# Patient Record
Sex: Female | Born: 1995 | Race: Black or African American | Hispanic: No | Marital: Single | Smoking: Current some day smoker
Health system: Southern US, Community
[De-identification: ages and names within clinical notes are randomized; demographics above are authoritative.]

## PROBLEM LIST (undated history)

## (undated) HISTORY — PX: TONSILLECTOMY: SUR1361

---

## 2019-08-12 ENCOUNTER — Encounter (HOSPITAL_BASED_OUTPATIENT_CLINIC_OR_DEPARTMENT_OTHER): Payer: Self-pay

## 2019-08-12 ENCOUNTER — Emergency Department (HOSPITAL_BASED_OUTPATIENT_CLINIC_OR_DEPARTMENT_OTHER)
Admission: EM | Admit: 2019-08-12 | Discharge: 2019-08-12 | Disposition: A | Discharge status: Home / Self Care | Payer: Self-pay | Attending: Emergency Medicine | Admitting: Emergency Medicine

## 2019-08-12 ENCOUNTER — Other Ambulatory Visit: Payer: Self-pay

## 2019-08-12 DIAGNOSIS — N939 Abnormal uterine and vaginal bleeding, unspecified: Secondary | ICD-10-CM | POA: Insufficient documentation

## 2019-08-12 DIAGNOSIS — F17200 Nicotine dependence, unspecified, uncomplicated: Secondary | ICD-10-CM | POA: Insufficient documentation

## 2019-08-12 DIAGNOSIS — M5441 Lumbago with sciatica, right side: Secondary | ICD-10-CM | POA: Insufficient documentation

## 2019-08-12 LAB — URINALYSIS, ROUTINE W REFLEX MICROSCOPIC
Bilirubin Urine: NEGATIVE
Glucose, UA: NEGATIVE mg/dL
Ketones, ur: NEGATIVE mg/dL
Leukocytes,Ua: NEGATIVE
Nitrite: NEGATIVE
Protein, ur: NEGATIVE mg/dL
Specific Gravity, Urine: >1.03 — ABNORMAL HIGH (ref 1.005–1.030)
pH: 5.5 (ref 5.0–8.0)

## 2019-08-12 LAB — URINALYSIS, MICROSCOPIC (REFLEX): RBC / HPF: >50 RBC/hpf (ref 0–5)

## 2019-08-12 LAB — PREGNANCY, URINE: Preg Test, Ur: NEGATIVE

## 2019-08-12 MED ORDER — METHOCARBAMOL 500 MG PO TABS: 500.0000 mg | ORAL_TABLET | Freq: Two times a day (BID) | ORAL | 0 refills | Status: AC

## 2019-08-12 MED ORDER — NAPROXEN 500 MG PO TABS
500.0000 mg | ORAL_TABLET | Freq: Two times a day (BID) | ORAL | 0 refills | Status: AC
Start: 2019-08-12 — End: ?

## 2019-08-12 NOTE — Discharge Instructions (Signed)
Please read and follow all provided instructions.  Your diagnoses today include:  1. Acute right-sided low back pain with right-sided sciatica     Tests performed today include:  Vital signs - see below for your results today  Medications prescribed:   Naproxen - anti-inflammatory pain medication  Do not exceed 500mg  naproxen every 12 hours, take with food  You have been prescribed an anti-inflammatory medication or NSAID. Take with food. Take smallest effective dose for the shortest duration needed for your pain. Stop taking if you experience stomach pain or vomiting.    Robaxin (methocarbamol) - muscle relaxer medication  DO NOT drive or perform any activities that require you to be awake and alert because this medicine can make you drowsy.   Take any prescribed medications only as directed.  Home care instructions:   Follow any educational materials contained in this packet  Please rest, use ice or heat on your back for the next several days  Do not lift, push, pull anything more than 10 pounds for the next week  Follow-up instructions: Please follow-up with your primary care provider in the next 2 weeks for further evaluation of your symptoms if not improving.   Return instructions:  SEEK IMMEDIATE MEDICAL ATTENTION IF YOU HAVE:  New numbness, tingling, weakness, or problem with the use of your arms or legs  Severe back pain not relieved with medications  Loss control of your bowels or bladder  Increasing pain in any areas of the body (such as chest or abdominal pain)  Shortness of breath, dizziness, or fainting.   Worsening nausea (feeling sick to your stomach), vomiting, fever, or sweats  Any other emergent concerns regarding your health   Additional Information:  Your vital signs today were: BP (!) 145/91 (BP Location: Right Arm)   Pulse (!) 116   Temp 98.5 F (36.9 C)   Resp 16   Ht 5\' 2"  (1.575 m)   Wt 77.1 kg   LMP 08/09/2019   SpO2 100%    BMI 31.09 kg/m  If your blood pressure (BP) was elevated above 135/85 this visit, please have this repeated by your doctor within one month. --------------

## 2019-08-12 NOTE — ED Provider Notes (Signed)
[facility] EMERGENCY DEPARTMENT Provider Note   CSN: [id number] Arrival date & time: 08/12/19  1604     History No chief complaint on file.   Sydney Pena is a 24 y.o. female.  Patient presents to the emergency department with complaints of approximately 1-1 and half weeks of right lower back pain.  Pain is worse with certain positions and movements.  Occasionally the pain shoots down the right buttock into the right leg.  She has been taking Tylenol without improvement.  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  Patient denies warning symptoms of back pain including: fecal incontinence, urinary retention or overflow incontinence, night sweats, waking from sleep with back pain, unexplained fevers or weight loss, h/o cancer, IVDU, recent trauma.           History reviewed. No pertinent past medical history.  There are no problems to display for this patient.   Past Surgical History:  Procedure Laterality Date   TONSILLECTOMY       OB History   No obstetric history on file.     History reviewed. No pertinent family history.  Social History   Tobacco Use   Smoking status: Current Some Day Smoker   Smokeless tobacco: Never Used  Substance Use Topics   Alcohol use: Yes    Comment: occ   Drug use: Not Currently    Home Medications Prior to Admission medications   Medication Sig Start Date End Date Taking? Authorizing Provider  methocarbamol (ROBAXIN) 500 MG tablet Take 1 tablet (500 mg total) by mouth 2 (two) times daily. 08/12/19   Renne Crigler, PA-C  naproxen (NAPROSYN) 500 MG tablet Take 1 tablet (500 mg total) by mouth 2 (two) times daily. 08/12/19   Renne Crigler, PA-C    Allergies    Penicillins and Sulfur  Review of Systems   Review of Systems  Constitutional: Negative for fever and unexpected weight change.  Gastrointestinal: Negative for constipation.       Negative for fecal incontinence.    Genitourinary: Positive for vaginal bleeding (currently menstruating). Negative for dysuria, flank pain, hematuria and pelvic pain.       Negative for urinary incontinence or retention.  Musculoskeletal: Positive for back pain.  Neurological: Negative for weakness and numbness.       Denies saddle paresthesias.    Physical Exam Updated Vital Signs BP (!) 145/91 (BP Location: Right Arm)    Pulse (!) 116    Temp 98.5 F (36.9 C)    Resp 16    Ht 5\' 2"  (1.575 m)    Wt 77.1 kg    LMP 08/09/2019    SpO2 100%    BMI 31.09 kg/m   Physical Exam Vitals and nursing note reviewed.  Constitutional:      Appearance: She is well-developed.  HENT:     Head: Normocephalic and atraumatic.  Eyes:     Conjunctiva/sclera: Conjunctivae normal.  Pulmonary:     Effort: Pulmonary effort is normal.  Abdominal:     Palpations: Abdomen is soft.     Tenderness: There is no abdominal tenderness.  Musculoskeletal:        General: Normal range of motion.     Cervical back: Normal range of motion and neck supple.     Thoracic back: Normal.     Lumbar back: Tenderness present. No bony tenderness. Normal range of motion.     Comments: No step-off noted with palpation of spine. Tenderness  mid to right lower lumbar spine in paraspinous muscles.   Skin:    General: Skin is warm and dry.     Findings: No rash.  Neurological:     Mental Status: She is alert.     Sensory: No sensory deficit.     Comments: 5/5 strength in entire lower extremities bilaterally. No sensation deficit. Ambulates normally without assistance or foot drop.      ED Results / Procedures / Treatments   Labs (all labs ordered are listed, but only abnormal results are displayed) Labs Reviewed  URINALYSIS, ROUTINE W REFLEX MICROSCOPIC - Abnormal; Notable for the following components:      Result Value   Specific Gravity, Urine >1.030 (*)    Hgb urine dipstick LARGE (*)    All other components within normal limits  URINALYSIS,  MICROSCOPIC (REFLEX) - Abnormal; Notable for the following components:   Bacteria, UA MANY (*)    All other components within normal limits  PREGNANCY, URINE    EKG None  Radiology No results found.  Procedures Procedures (including critical care time)  Medications Ordered in ED Medications - No data to display  ED Course  I have reviewed the triage vital signs and the nursing notes.  Pertinent labs & imaging results that were available during my care of the patient were reviewed by me and considered in my medical decision making (see chart for details).  5:18 PM Patient seen and examined.  Vital signs reviewed and are as follows: Vitals:   08/12/19 1621  BP: (!) 145/91  Pulse: (!) 116  Resp: 16  Temp: 98.5 F (36.9 C)  SpO2: 100%    No red flag s/s of low back pain. Patient was counseled on back pain precautions and told to do activity as tolerated but do not lift, push, or pull heavy objects more than 10 pounds for the next week.  Patient counseled to use ice or heat on back for no longer than 15 minutes every hour.   Patient counseled on proper use of muscle relaxant medication.  They were told not to drink alcohol, drive any vehicle, or do any dangerous activities while taking this medication.  Patient verbalized understanding.  Patient urged to follow-up with PCP if pain does not improve with treatment and rest or if pain becomes recurrent. Urged to return with worsening severe pain, loss of bowel or bladder control, trouble walking.   The patient verbalizes understanding and agrees with the plan.    MDM Rules/Calculators/A&P                          Patient with back pain with occasional radicular features. No neurological deficits. Patient is ambulatory. No warning symptoms of back pain including: fecal incontinence, urinary retention or overflow incontinence, night sweats, waking from sleep with back pain, unexplained fevers or weight loss, h/o cancer, IVDU,  recent trauma. No concern for cauda equina, epidural abscess, or other serious cause of back pain. Conservative measures such as rest, ice/heat and pain medicine indicated with PCP follow-up if no improvement with conservative management.   UA collected.  Blood likely related to ongoing menstrual period contamination.  Low concern for UTI, kidney stone.  Final Clinical Impression(s) / ED Diagnoses Final diagnoses:  Acute right-sided low back pain with right-sided sciatica    Rx / DC Orders ED Discharge Orders         Ordered    naproxen (NAPROSYN) 500 MG tablet  2 times daily     Discontinue  Reprint     08/12/19 1714    methocarbamol (ROBAXIN) 500 MG tablet  2 times daily     Discontinue  Reprint     08/12/19 1714           Renne Crigler, PA-C 08/12/19 1719    Gwyneth Sprout, MD 08/12/19 [id number]

## 2019-08-12 NOTE — ED Triage Notes (Signed)
July 30th, lower right sided back pain that radiates down right side buttocks that sometimes sends pains/"twitches" down posterior leg. Believes it may be due to lifting heavy objects. Pain occurs when bending over and sitting. Tylenol without relief.

## 2019-08-26 ENCOUNTER — Other Ambulatory Visit: Payer: Self-pay

## 2019-12-11 ENCOUNTER — Other Ambulatory Visit: Payer: Self-pay

## 2019-12-11 ENCOUNTER — Emergency Department (HOSPITAL_BASED_OUTPATIENT_CLINIC_OR_DEPARTMENT_OTHER): Payer: Self-pay

## 2019-12-11 ENCOUNTER — Encounter (HOSPITAL_BASED_OUTPATIENT_CLINIC_OR_DEPARTMENT_OTHER): Payer: Self-pay

## 2019-12-11 ENCOUNTER — Emergency Department (HOSPITAL_BASED_OUTPATIENT_CLINIC_OR_DEPARTMENT_OTHER)
Admission: EM | Admit: 2019-12-11 | Discharge: 2019-12-11 | Disposition: A | Discharge status: Home / Self Care | Payer: Self-pay | Attending: Emergency Medicine | Admitting: Emergency Medicine

## 2019-12-11 DIAGNOSIS — J069 Acute upper respiratory infection, unspecified: Secondary | ICD-10-CM | POA: Insufficient documentation

## 2019-12-11 DIAGNOSIS — Z20822 Contact with and (suspected) exposure to covid-19: Secondary | ICD-10-CM | POA: Insufficient documentation

## 2019-12-11 DIAGNOSIS — F17200 Nicotine dependence, unspecified, uncomplicated: Secondary | ICD-10-CM | POA: Insufficient documentation

## 2019-12-11 DIAGNOSIS — R0602 Shortness of breath: Secondary | ICD-10-CM

## 2019-12-11 LAB — CBC WITH DIFFERENTIAL/PLATELET
Abs Immature Granulocytes: 0.03 10*3/uL (ref 0.00–0.07)
Basophils Absolute: 0 10*3/uL (ref 0.0–0.1)
Basophils Relative: 0 %
Eosinophils Absolute: 0 10*3/uL (ref 0.0–0.5)
Eosinophils Relative: 0 %
HCT: 41.9 % (ref 36.0–46.0)
Hemoglobin: 14.1 g/dL (ref 12.0–15.0)
Immature Granulocytes: 0 %
Lymphocytes Relative: 11 %
Lymphs Abs: 1 10*3/uL (ref 0.7–4.0)
MCH: 29.3 pg (ref 26.0–34.0)
MCHC: 33.7 g/dL (ref 30.0–36.0)
MCV: 86.9 fL (ref 80.0–100.0)
Monocytes Absolute: 0.5 10*3/uL (ref 0.1–1.0)
Monocytes Relative: 6 %
Neutro Abs: 7.1 10*3/uL (ref 1.7–7.7)
Neutrophils Relative %: 83 %
Platelets: 266 10*3/uL (ref 150–400)
RBC: 4.82 MIL/uL (ref 3.87–5.11)
RDW: 12 % (ref 11.5–15.5)
WBC: 8.7 10*3/uL (ref 4.0–10.5)
nRBC: 0 % (ref 0.0–0.2)

## 2019-12-11 LAB — BASIC METABOLIC PANEL
Anion gap: 12 (ref 5–15)
BUN: 7 mg/dL (ref 6–20)
CO2: 24 mmol/L (ref 22–32)
Calcium: 9 mg/dL (ref 8.9–10.3)
Chloride: 100 mmol/L (ref 98–111)
Creatinine, Ser: 0.71 mg/dL (ref 0.44–1.00)
GFR, Estimated: >60 mL/min (ref >60)
Glucose, Bld: 93 mg/dL (ref 70–99)
Potassium: 3.4 mmol/L — ABNORMAL LOW (ref 3.5–5.1)
Sodium: 136 mmol/L (ref 135–145)

## 2019-12-11 LAB — TROPONIN I (HIGH SENSITIVITY): Troponin I (High Sensitivity): <2 ng/L (ref <18)

## 2019-12-11 LAB — D-DIMER, QUANTITATIVE: D-Dimer, Quant: <0.27 ug/mL-FEU (ref 0.00–0.50)

## 2019-12-11 LAB — RESP PANEL BY RT-PCR (FLU A&B, COVID) ARPGX2
Influenza A by PCR: NEGATIVE
Influenza B by PCR: NEGATIVE
SARS Coronavirus 2 by RT PCR: NEGATIVE

## 2019-12-11 MED ORDER — ALBUTEROL SULFATE HFA 108 (90 BASE) MCG/ACT IN AERS: 2.0000 | INHALATION_SPRAY | RESPIRATORY_TRACT | Status: DC | PRN

## 2019-12-11 MED ORDER — ALBUTEROL SULFATE (2.5 MG/3ML) 0.083% IN NEBU: 5.0000 mg | INHALATION_SOLUTION | Freq: Once | RESPIRATORY_TRACT | Status: DC

## 2019-12-11 MED ORDER — ALBUTEROL SULFATE HFA 108 (90 BASE) MCG/ACT IN AERS
INHALATION_SPRAY | RESPIRATORY_TRACT | Status: AC
  Administered 2019-12-11: 2 via RESPIRATORY_TRACT
  Filled 2019-12-11: qty 6.7

## 2019-12-11 NOTE — ED Notes (Signed)
Ambulated from lobby to room 11.  HR 125-135, SpO2 95-100%, constant cough, NP. Home HFA Albuterol out of date 07/2018.

## 2019-12-11 NOTE — Discharge Instructions (Addendum)
You were evaluated in the emergency department today for your sore throat, cough, headache.  Your physical exam, vital signs, blood work, EKG, and chest x-ray were all very reassuring.  There is no sign of blood clot in your lungs, or changes in your EKG that would be suggestive of cardiac abnormality.  You tested negative for COVID-19, influenza A/B.  You likely have another viral upper respiratory infection.  You may continue to utilize albuterol at home as needed for your chest tightness.  You may utilize ibuprofen or Tylenol as needed for your discomfort.  Return to the emergency department if you develop any new chest pain, difficulty breathing, palpitations, abdominal pain, nausea or vomiting that does not stop, or any other new severe symptoms.

## 2019-12-11 NOTE — ED Triage Notes (Signed)
Cough, scratchy throat, shortness of breath since yesterday, headache last night. Denies covid exposure, low grade fever.  Took covid test yesterday at [facility] awaiting result.  Not vaccinated for covid.

## 2019-12-11 NOTE — ED Provider Notes (Signed)
[facility] EMERGENCY DEPARTMENT Provider Note   CSN: [id number] Arrival date & time: 12/11/19  4098     History Chief Complaint  Patient presents with  . Cough  . Shortness of Breath    Sydney Pena is a 24 y.o. female who presents with concern for 3 days of "scratchy throat", sensation of thick mucus in her throat, now with headache, cough, chest heaviness, and shortness of breath since yesterday.  She states she feels it is hard for her to take a deep breath, sensation of something large sitting on her chest at this time. She endorses mild abdominal pain last night, but felt it was more likely gas.  She denies nausea, vomiting, diarrhea and abdominal pain at this time.  Denies dysuria, urinary frequency or urgency, hematuria.  She denies any recent travel, or surgical procedures, denies personal history of blood clots, hormone replacement, or history of malignancy She does endorse family history of blood clots, as well as extensive cardiac disease and hypertension.  She also has history of mild intermittent asthma; she utilized albuterol inhaler last night at home without relief, however states that it was expired by several years, so she is unsure if it would be effective regardless.   I personally reviewed patient's medical records.  She has history of mild intermittent asthma, tonsillectomy. but is otherwise a healthy 24 year old female without medical diagnosis but is not taking medications every day.  She has not been vaccinated COVID-19.   She does endorse recent sick contacts at work, as well as socially. None of these contacts have been diagnosed with COVID-19.  She was tested yesterday for COVID-19 at [facility], however has not received the results.  She was not tested for influenza yesterday.  HPI     History reviewed. No pertinent past medical history.  There are no problems to display for this patient.   Past Surgical History:  Procedure Laterality Date   . TONSILLECTOMY       OB History   No obstetric history on file.     History reviewed. No pertinent family history.  Social History   Tobacco Use  . Smoking status: Current Some Day Smoker  . Smokeless tobacco: Never Used  Substance Use Topics  . Alcohol use: Yes    Comment: occ  . Drug use: Not Currently    Home Medications Prior to Admission medications   Medication Sig Start Date End Date Taking? Authorizing Provider  albuterol (VENTOLIN HFA) 108 (90 Base) MCG/ACT inhaler Inhale 2 puffs into the lungs every 6 (six) hours as needed for wheezing or shortness of breath.   Yes [provider]  methocarbamol (ROBAXIN) 500 MG tablet Take 1 tablet (500 mg total) by mouth 2 (two) times daily. 08/12/19   Renne Crigler, PA-C  naproxen (NAPROSYN) 500 MG tablet Take 1 tablet (500 mg total) by mouth 2 (two) times daily. 08/12/19   Renne Crigler, PA-C    Allergies    Penicillins and Sulfur  Review of Systems   Review of Systems  Constitutional: Positive for activity change, chills and fatigue. Negative for appetite change, diaphoresis and fever.  HENT: Positive for sore throat. Negative for congestion and voice change.   Eyes: Negative.   Respiratory: Positive for cough, chest tightness and shortness of breath.   Cardiovascular: Negative for chest pain, palpitations and leg swelling.  Gastrointestinal: Positive for abdominal pain. Negative for blood in stool, constipation, diarrhea, nausea and vomiting.  Endocrine: Negative.   Genitourinary: Negative.  Musculoskeletal: Negative.   Skin: Negative.   Allergic/Immunologic: Negative.   Neurological: Positive for headaches. Negative for dizziness, syncope, weakness and light-headedness.  Hematological: Negative.   Psychiatric/Behavioral: Negative.     Physical Exam Updated Vital Signs BP (!) 127/98 (BP Location: Right Arm)   Pulse 92   Temp 98.7 F (37.1 C) (Oral)   Resp 18   Ht 5\' 2"  (1.575 m)   Wt 75.8 kg   SpO2  100%   BMI 30.54 kg/m   Physical Exam Vitals and nursing note reviewed.  Constitutional:      Appearance: She is not ill-appearing.  HENT:     Head: Normocephalic and atraumatic.     Nose: Nose normal.     Mouth/Throat:     Mouth: Mucous membranes are moist.     Pharynx: Oropharynx is clear. Uvula midline. Posterior oropharyngeal erythema present. No oropharyngeal exudate.     Comments: Tonsils are absent.  Mild posterior pharyngeal erythema without exudate.  Eyes:     General:        Right eye: No discharge.        Left eye: No discharge.     Extraocular Movements: Extraocular movements intact.     Conjunctiva/sclera: Conjunctivae normal.     Pupils: Pupils are equal, round, and reactive to light.  Neck:     Thyroid: No thyromegaly.     Trachea: Trachea and phonation normal. No tracheal deviation.  Cardiovascular:     Rate and Rhythm: Normal rate and regular rhythm.     Pulses: Normal pulses.          Radial pulses are 2+ on the left side.       Dorsalis pedis pulses are 2+ on the right side and 2+ on the left side.     Heart sounds: Normal heart sounds. No murmur heard.   Pulmonary:     Effort: Pulmonary effort is normal. No respiratory distress.     Breath sounds: Normal breath sounds. No wheezing or rales.     Comments: Patient was administered 2 puffs of albuterol by respiratory therapist in triage, no wheezing of the lungs at this time. Chest:     Chest wall: No deformity, swelling, tenderness, crepitus or edema.  Abdominal:     General: Bowel sounds are normal. There is no distension.     Tenderness: There is no abdominal tenderness.  Musculoskeletal:        General: No deformity. Normal range of motion.     Cervical back: Normal range of motion and neck supple.     Right lower leg: No edema.     Left lower leg: No edema.  Lymphadenopathy:     Cervical: No cervical adenopathy.  Skin:    General: Skin is warm and dry.     Capillary Refill: Capillary refill  takes less than 2 seconds.  Neurological:     General: No focal deficit present.     Mental Status: She is alert and oriented to person, place, and time. Mental status is at baseline.     Gait: Gait is intact.  Psychiatric:        Mood and Affect: Mood normal.     ED Results / Procedures / Treatments   Labs (all labs ordered are listed, but only abnormal results are displayed) Labs Reviewed  BASIC METABOLIC PANEL - Abnormal; Notable for the following components:      Result Value   Potassium 3.4 (*)    All other  components within normal limits  RESP PANEL BY RT-PCR (FLU A&B, COVID) ARPGX2  CBC WITH DIFFERENTIAL/PLATELET  D-DIMER, QUANTITATIVE (NOT AT [facility])  TROPONIN I (HIGH SENSITIVITY)    EKG EKG Interpretation  Date/Time:  Sunday December 11 2019 10:50:12 EST Ventricular Rate:  99 PR Interval:    QRS Duration: 84 QT Interval:  329 QTC Calculation: 423 R Axis:   81 Text Interpretation: Sinus rhythm No STEMI Confirmed by Trifan, Matthew ([id number]) on 12/11/2019 10:54:15 AM Also confirmed by Trifan, Matthew ([id number]), editor [place], Beverly ([id number])  on 12/12/2019 7:34:41 AM   Radiology DG Chest Port 1 View  Result Date: 12/11/2019 CLINICAL DATA:  Cough. EXAM: PORTABLE CHEST 1 VIEW COMPARISON:  None. FINDINGS: 1016 hours. The lungs are clear without focal pneumonia, edema, pneumothorax or pleural effusion. The cardiopericardial silhouette is within normal limits for size. The visualized bony structures of the thorax show no acute abnormality. IMPRESSION: No active disease. Electronically Signed   By: Eric  Mansell M.D.   On: 12/11/2019 10:43    Procedures Procedures (including critical care time)  Medications Ordered in ED Medications - No data to display  ED Course  I have reviewed the triage vital signs and the nursing notes.  Pertinent labs & imaging results that were available during my care of the patient were reviewed by me and considered in my medical decision  making (see chart for details).    MDM Rules/Calculators/A&P                         24  year old female who presents with concern for 3 days of sore throat, mild cough, now with shortness of breath and chest heaviness since yesterday.  Patient is not vaccinated against COVID-19.  Positive family history for blood clot.  Differential diagnosis for this patient's symptoms include but are not limited to PE, ACS, reactive airway disease, COVID-19, influenza, other viral URI, pneumonia, dysrhythmia, strep pharyngitis, GERD, pleural effusion/pericardial effusion, myocarditis.    Patient is tachycardic on intake to 120, mildly hypertensive to 123/95.  Patient is afebrile, oxygen saturation is 100% on room air.  Physical exam significant for mild posterior pharyngeal erythema.  Patient is tachycardic to the 120s at the time of my initial evaluation, however she was recently administered albuterol inhaler which may be causing tachycardia.  Pulmonary exam is normal at this time, as is the abdominal exam.  Given tachycardia, patient is Not PERC negative; given positive family history we will proceed with D-dimer in context of this patient symptoms.  Additionally will obtain other basic laboratory studies as well as troponin, chest x-ray, EKG.  Chest x-ray negative for cardiopulmonary disease.  EKG with normal sinus rhythm, no STEMI.  Heart rate at time of EKG 99 bpm.  CBC unremarkable, BMP mild hypokalemia 3.4.  Troponin negative, less than 2.  D-dimer negative less than 0.27.  Respiratory pathogen panel negative for COVID-19, influenza A/B.  At time my reevaluation of the patient cardiopulmonary exam is normal.  She is no longer tachycardic, heart rate is now in the 80s.  I suspect tachycardia was secondary to administration of albuterol in triage.  No further work-up is warranted in the emergency department at this time, given reassuring physical exam, vital signs, and laboratory studies.  I  suspect patient has another viral upper respiratory infection.  She may continue to utilize albuterol at home as needed for chest tightness.  May utilize Tylenol and ibuprofen as needed for discomfort.  Recommend follow-up with  primary care doctor.  Sydney Pena voiced understanding of her medical evaluation and treatment plan.  Each of her questions were answered to her expressed satisfaction.  Return precautions given.  Patient is well-appearing, stable, and appropriate for discharge at this time.   Final Clinical Impression(s) / ED Diagnoses Final diagnoses:  Upper respiratory tract infection, unspecified type    Rx / DC Orders ED Discharge Orders    None       Sherrilee Gilles 12/12/19 1523    Terald Sleeper, MD 12/13/19 1002

## 2020-07-14 ENCOUNTER — Encounter (INDEPENDENT_AMBULATORY_CARE_PROVIDER_SITE_OTHER): Payer: Self-pay

## 2020-09-28 ENCOUNTER — Emergency Department (HOSPITAL_COMMUNITY)
Admission: EM | Admit: 2020-09-28 | Discharge: 2020-09-28 | Disposition: A | Discharge status: Home / Self Care | Payer: Self-pay | Attending: Emergency Medicine | Admitting: Emergency Medicine

## 2020-09-28 ENCOUNTER — Other Ambulatory Visit: Payer: Self-pay

## 2020-09-28 ENCOUNTER — Encounter (HOSPITAL_COMMUNITY): Payer: Self-pay

## 2020-09-28 DIAGNOSIS — F17200 Nicotine dependence, unspecified, uncomplicated: Secondary | ICD-10-CM | POA: Insufficient documentation

## 2020-09-28 DIAGNOSIS — Z203 Contact with and (suspected) exposure to rabies: Secondary | ICD-10-CM | POA: Insufficient documentation

## 2020-09-28 DIAGNOSIS — Z2914 Encounter for prophylactic rabies immune globin: Secondary | ICD-10-CM | POA: Insufficient documentation

## 2020-09-28 DIAGNOSIS — W540XXA Bitten by dog, initial encounter: Secondary | ICD-10-CM

## 2020-09-28 DIAGNOSIS — Z23 Encounter for immunization: Secondary | ICD-10-CM | POA: Insufficient documentation

## 2020-09-28 DIAGNOSIS — S0185XA Open bite of other part of head, initial encounter: Secondary | ICD-10-CM | POA: Insufficient documentation

## 2020-09-28 DIAGNOSIS — S01511A Laceration without foreign body of lip, initial encounter: Secondary | ICD-10-CM | POA: Insufficient documentation

## 2020-09-28 MED ORDER — RABIES VACCINE, PCEC IM SUSR
1.0000 mL | Freq: Once | INTRAMUSCULAR | Status: AC
  Administered 2020-09-28: 1 mL via INTRAMUSCULAR
  Filled 2020-09-28: qty 1

## 2020-09-28 MED ORDER — OXYCODONE-ACETAMINOPHEN 5-325 MG PO TABS: 1.0000 | ORAL_TABLET | Freq: Three times a day (TID) | ORAL | 0 refills | Status: AC | PRN

## 2020-09-28 MED ORDER — OXYCODONE-ACETAMINOPHEN 5-325 MG PO TABS
1.0000 | ORAL_TABLET | Freq: Once | ORAL | Status: AC
  Administered 2020-09-28: 1 via ORAL
  Filled 2020-09-28: qty 1

## 2020-09-28 MED ORDER — DOXYCYCLINE HYCLATE 100 MG PO CAPS: 100.0000 mg | ORAL_CAPSULE | Freq: Two times a day (BID) | ORAL | 0 refills | Status: AC

## 2020-09-28 MED ORDER — RABIES IMMUNE GLOBULIN 150 UNIT/ML IM INJ
20.0000 [IU]/kg | INJECTION | Freq: Once | INTRAMUSCULAR | Status: AC
  Administered 2020-09-28: 1500 [IU] via INTRAMUSCULAR
  Filled 2020-09-28: qty 10

## 2020-09-28 MED ORDER — LIDOCAINE HCL (PF) 1 % IJ SOLN
5.0000 mL | Freq: Once | INTRAMUSCULAR | Status: AC
  Administered 2020-09-28: 5 mL
  Filled 2020-09-28: qty 30

## 2020-09-28 NOTE — Discharge Instructions (Signed)
You have received a total of 11 sutures please refrain from getting wet for the first 24 hours after that like you to rinse out the wound with clean water 2-3 times daily.  I have started you on antibiotics please take as prescribed.I have given you a short course of narcotics please take as prescribed.  This medication can make you drowsy do not consume alcohol or operate heavy machinery when taking this medication.  This medication is Tylenol in it do not take Tylenol and take this medication.  You must follow-up next 3 to 5 days have your sutures removed may come back to this department, urgent care PCP.  Also given information for a plastic surgeon you may follow-up with them if he would like scar revisions.  I have started on the rabies vaccination have given you information on when and where you can  obtain your next shot, you may go to urgent care, this department, your PCP.  Come back to the emergency department if you develop chest pain, shortness of breath, severe abdominal pain, uncontrolled nausea, vomiting, diarrhea.

## 2020-09-28 NOTE — ED Provider Notes (Signed)
[facility]-EMERGENCY DEPT Provider Note   CSN: [id number] Arrival date & time: 09/28/20  0042     History Chief Complaint  Patient presents with   Animal Bite    Sydney Pena is a 25 y.o. female.  HPI  Patient with no significant medical history presents to the emergency department with chief complaint of being bit by a dog.  Patient states early this morning around 12:30 AM she was taking out the garbage when she noted a dog by the dumpster this startled the dog and attacked her.  She states she was bit on her face and lip.  She was able to get away from the dog.  She states this is a [employer] data [employer] unaware of its vaccine records.  She is not immunocompromise, is up-to-date on her tetanus shot.  She has no other complaints.  She denies hitting her head, losing conscious, is not on anticoagulant.  She denies facial pain, neck pain, back pain, chest pain, abdominal pain she has no other complaints.  History reviewed. No pertinent past medical history.  There are no problems to display for this patient.   Past Surgical History:  Procedure Laterality Date   TONSILLECTOMY       OB History   No obstetric history on file.     History reviewed. No pertinent family history.  Social History   Tobacco Use   Smoking status: Some Days   Smokeless tobacco: Never  Substance Use Topics   Alcohol use: Yes    Comment: occ   Drug use: Not Currently    Home Medications Prior to Admission medications   Medication Sig Start Date End Date Taking? Authorizing Provider  doxycycline (VIBRAMYCIN) 100 MG capsule Take 1 capsule (100 mg total) by mouth 2 (two) times daily for 7 days. 09/28/20 10/05/20 Yes Carroll Sage, PA-C  oxyCODONE-acetaminophen (PERCOCET/ROXICET) 5-325 MG tablet Take 1 tablet by mouth every 8 (eight) hours as needed for up to 3 days for severe pain. 09/28/20 10/01/20 Yes Carroll Sage, PA-C  albuterol (VENTOLIN HFA) 108 (90 Base) MCG/ACT  inhaler Inhale 2 puffs into the lungs every 6 (six) hours as needed for wheezing or shortness of breath.    [provider]  methocarbamol (ROBAXIN) 500 MG tablet Take 1 tablet (500 mg total) by mouth 2 (two) times daily. 08/12/19   Renne Crigler, PA-C  naproxen (NAPROSYN) 500 MG tablet Take 1 tablet (500 mg total) by mouth 2 (two) times daily. 08/12/19   Renne Crigler, PA-C    Allergies    Elemental sulfur and Penicillins  Review of Systems   Review of Systems  Constitutional:  Negative for chills and fever.  HENT:  Negative for congestion.   Respiratory:  Negative for shortness of breath.   Cardiovascular:  Negative for chest pain.  Gastrointestinal:  Negative for abdominal pain.  Genitourinary:  Negative for enuresis.  Musculoskeletal:  Negative for back pain.  Skin:  Positive for wound. Negative for rash.  Neurological:  Negative for dizziness.  Hematological:  Does not bruise/bleed easily.   Physical Exam Updated Vital Signs BP 114/69   Pulse 62   Temp 97.9 F (36.6 C) (Oral)   Resp 18   Ht 5\' 2"  (1.575 m)   Wt 75.8 kg   SpO2 100%   BMI 30.56 kg/m   Physical Exam Vitals and nursing note reviewed.  Constitutional:      General: She is not in acute distress.    Appearance: Normal appearance.  She is not ill-appearing or diaphoretic.  HENT:     Head: Normocephalic and atraumatic.     Comments: Patient has abrasions and lacerations noted on her left cheek and upper lip.  She has a V shaped laceration on her left cheek superficial nature, hemodynamically stable no foreign body ligament or tendon damage present.  She also has been to lacerations that are parallel to 1 another on her left upper lip they run horizontally.  They have jagged edges, they were superficial nature, hemodynamically stable.  Please see picture for full detail.    Nose: No congestion or rhinorrhea.     Mouth/Throat:     Mouth: Mucous membranes are moist.     Pharynx: Oropharynx is clear. No  oropharyngeal exudate or posterior oropharyngeal erythema.     Comments: Oropharynx is visualized tongue uvula are both midline, controlling oral secretions.  No noted dental trauma present.  No trismus or torticollis noted. Eyes:     General: No scleral icterus.       Right eye: No discharge.        Left eye: No discharge.     Conjunctiva/sclera: Conjunctivae normal.  Cardiovascular:     Rate and Rhythm: Normal rate and regular rhythm.  Pulmonary:     Effort: Pulmonary effort is normal.  Musculoskeletal:        General: Normal range of motion.     Cervical back: Neck supple.     Right lower leg: No edema.     Left lower leg: No edema.  Skin:    General: Skin is warm and dry.     Coloration: Skin is not jaundiced or pale.     Comments: Limited skin exam was performed there is no noted abnormalities present patient's upper and or lower extremities, abdomen and her back.  Neurological:     Mental Status: She is alert and oriented to person, place, and time.  Psychiatric:        Mood and Affect: Mood normal.       ED Results / Procedures / Treatments   Labs (all labs ordered are listed, but only abnormal results are displayed) Labs Reviewed - No data to display  EKG None  Radiology No results found.  Procedures .Marland KitchenLaceration Repair  Date/Time: 09/28/2020 10:10 AM Performed by: Carroll Sage, PA-C Authorized by: Carroll Sage, PA-C   Consent:    Consent obtained:  Verbal   Consent given by:  Patient   Risks discussed:  Infection, pain, retained foreign body, need for additional repair, poor cosmetic result, tendon damage, vascular damage, poor wound healing and nerve damage   Alternatives discussed:  No treatment, delayed treatment, observation and referral Universal protocol:    Patient identity confirmed:  Verbally with patient Anesthesia:    Anesthesia method:  Local infiltration   Local anesthetic:  Lidocaine 1% w/o epi Laceration details:     Location:  Face   Face location:  L cheek   Length (cm):  4   Depth (mm):  2 Pre-procedure details:    Preparation:  Patient was prepped and draped in usual sterile fashion Exploration:    Limited defect created (wound extended): no     Hemostasis achieved with:  Direct pressure   Wound exploration: wound explored through full range of motion and entire depth of wound visualized     Contaminated: no   Treatment:    Area cleansed with:  Saline   Amount of cleaning:  Standard   Visualized  foreign bodies/material removed: no     Debridement:  None Skin repair:    Repair method:  Sutures   Suture size:  6-0   Suture material:  Prolene   Suture technique:  Simple interrupted   Number of sutures:  6 Approximation:    Approximation:  Close Repair type:    Repair type:  Simple Post-procedure details:    Dressing:  Non-adherent dressing   Procedure completion:  Tolerated well, no immediate complications .Marland KitchenLaceration Repair  Date/Time: 09/28/2020 10:12 AM Performed by: Carroll Sage, PA-C Authorized by: Carroll Sage, PA-C   Consent:    Consent obtained:  Verbal   Consent given by:  Patient   Risks discussed:  Infection, pain, retained foreign body, need for additional repair, poor cosmetic result, tendon damage, vascular damage, poor wound healing and nerve damage   Alternatives discussed:  No treatment, delayed treatment, observation and referral Universal protocol:    Patient identity confirmed:  Verbally with patient Anesthesia:    Anesthesia method:  Local infiltration   Local anesthetic:  Lidocaine 1% w/o epi Laceration details:    Location:  Lip   Lip location:  Upper exterior lip   Length (cm):  3   Depth (mm):  2 Pre-procedure details:    Preparation:  Patient was prepped and draped in usual sterile fashion Exploration:    Limited defect created (wound extended): no     Hemostasis achieved with:  Direct pressure   Wound exploration: wound explored  through full range of motion and entire depth of wound visualized     Contaminated: no   Treatment:    Area cleansed with:  Saline   Amount of cleaning:  Standard   Visualized foreign bodies/material removed: no     Debridement:  None Skin repair:    Repair method:  Sutures   Suture size:  6-0   Suture material:  Prolene   Suture technique:  Simple interrupted   Number of sutures:  3 Approximation:    Approximation:  Loose   Vermilion border well-aligned: yes   Repair type:    Repair type:  Intermediate Post-procedure details:    Dressing:  Non-adherent dressing   Procedure completion:  Tolerated well, no immediate complications .Marland KitchenLaceration Repair  Date/Time: 09/28/2020 10:13 AM Performed by: Carroll Sage, PA-C Authorized by: Carroll Sage, PA-C   Consent:    Consent obtained:  Verbal   Consent given by:  Patient   Risks discussed:  Infection, pain, retained foreign body, need for additional repair, poor cosmetic result, tendon damage, vascular damage, poor wound healing and nerve damage   Alternatives discussed:  No treatment, delayed treatment, observation and referral Universal protocol:    Patient identity confirmed:  Verbally with patient Anesthesia:    Anesthesia method:  Local infiltration   Local anesthetic:  Lidocaine 1% w/o epi Laceration details:    Location:  Lip   Lip location:  Upper exterior lip   Length (cm):  3   Depth (mm):  2 Pre-procedure details:    Preparation:  Patient was prepped and draped in usual sterile fashion Exploration:    Limited defect created (wound extended): no     Wound exploration: wound explored through full range of motion and entire depth of wound visualized     Contaminated: no   Treatment:    Area cleansed with:  Saline   Amount of cleaning:  Standard   Visualized foreign bodies/material removed: no     Debridement:  None  Skin repair:    Repair method:  Sutures   Suture size:  6-0   Suture material:   Prolene   Suture technique:  Simple interrupted   Number of sutures:  4 Approximation:    Approximation:  Loose   Vermilion border well-aligned: yes   Repair type:    Repair type:  Intermediate Post-procedure details:    Dressing:  Non-adherent dressing   Procedure completion:  Tolerated well, no immediate complications   Medications Ordered in ED Medications  oxyCODONE-acetaminophen (PERCOCET/ROXICET) 5-325 MG per tablet 1 tablet (1 tablet Oral Given 09/28/20 0755)  rabies immune globulin (HYPERAB/KEDRAB) injection 1,500 Units (1,500 Units Intramuscular Given 09/28/20 0831)  rabies vaccine (RABAVERT) injection 1 mL (1 mL Intramuscular Given 09/28/20 0826)  lidocaine (PF) (XYLOCAINE) 1 % injection 5 mL (5 mLs Infiltration Given 09/28/20 9798)    ED Course  I have reviewed the triage vital signs and the nursing notes.  Pertinent labs & imaging results that were available during my care of the patient were reviewed by me and considered in my medical decision making (see chart for details).    MDM Rules/Calculators/A&P                          Initial impression-patient presents after being attacked by dog.  She is alert, does not appear in acute stress, vital signs reassuring.  Will recommend suturing for improved wound healing.  Also start patient on rabies vaccine schedule.  Work-up-due to well-appearing patient, benign physical exam, further lab work and imaging not want at this time.  Reassessment Will recommend suturing to decrease infection risk and to assist with the healing process.  Patient was agreeable with this and tolerated the procedure well. She received a total of 11 sutures, 6 in the left cheek, 3 in the laceration on the upper lip for this to the left, and 4 sutures in the laceration closer to the right.     Rule out- Low suspicion for fracture or dislocation there is no gross deformities present my exam, no trismus or torticollis present, will defer imaging at this  time.  Low suspicion for ligament or tendon damage as area was palpated no gross defects noted, patient can open and close her mouth out difficulty.  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  We will also have her follow-up with plastic surgery for further evaluation if needed. Dog bite-patient was started on the rabies vaccination schedule as this is unknown vaccinated dog.  We will provide her with information for her rabies.  Vital signs have remained stable, no indication for hospital admission.  Patient discussed with attending and they agreed with assessment and plan.  Patient given at home care as well strict return precautions.  Patient verbalized that they understood agreed to said plan.  Final Clinical Impression(s) / ED Diagnoses Final diagnoses:  Dog bite, initial encounter    Rx / DC Orders ED Discharge Orders          Ordered    doxycycline (VIBRAMYCIN) 100 MG capsule  2 times daily  09/28/20 1022    oxyCODONE-acetaminophen (PERCOCET/ROXICET) 5-325 MG tablet  Every 8 hours PRN        09/28/20 1023             Barnie Del 09/28/20 1027    Linwood Dibbles, MD 09/30/20 [phone]

## 2020-09-28 NOTE — ED Triage Notes (Signed)
Pt was bit on the left side of her lip and face by a pit bull that was by the dumpster in her neighborhood.

## 2020-10-01 ENCOUNTER — Ambulatory Visit (HOSPITAL_COMMUNITY)
Admission: RE | Admit: 2020-10-01 | Discharge: 2020-10-01 | Disposition: A | Discharge status: Home / Self Care | Payer: Self-pay | Source: Ambulatory Visit | Attending: Internal Medicine | Admitting: Internal Medicine

## 2020-10-01 ENCOUNTER — Other Ambulatory Visit: Payer: Self-pay

## 2020-10-01 DIAGNOSIS — Z203 Contact with and (suspected) exposure to rabies: Secondary | ICD-10-CM

## 2020-10-01 MED ORDER — RABIES VACCINE, PCEC IM SUSR
1.0000 mL | Freq: Once | INTRAMUSCULAR | Status: AC
  Administered 2020-10-01: 1 mL via INTRAMUSCULAR

## 2020-10-01 MED ORDER — RABIES VACCINE, PCEC IM SUSR
INTRAMUSCULAR | Status: AC
  Filled 2020-10-01: qty 1

## 2020-10-01 NOTE — ED Triage Notes (Signed)
Pt presents for rabies vaccine.  °

## 2020-10-04 ENCOUNTER — Encounter (HOSPITAL_COMMUNITY): Payer: Self-pay

## 2020-10-05 ENCOUNTER — Ambulatory Visit (HOSPITAL_COMMUNITY): Payer: Self-pay

## 2020-10-06 ENCOUNTER — Ambulatory Visit (HOSPITAL_COMMUNITY)
Admission: RE | Admit: 2020-10-06 | Discharge: 2020-10-06 | Disposition: A | Discharge status: Home / Self Care | Payer: Self-pay | Source: Ambulatory Visit | Attending: Internal Medicine | Admitting: Internal Medicine

## 2020-10-06 ENCOUNTER — Other Ambulatory Visit: Payer: Self-pay

## 2020-10-06 DIAGNOSIS — Z203 Contact with and (suspected) exposure to rabies: Secondary | ICD-10-CM

## 2020-10-06 DIAGNOSIS — Z4802 Encounter for removal of sutures: Secondary | ICD-10-CM

## 2020-10-06 MED ORDER — RABIES VACCINE, PCEC IM SUSR
INTRAMUSCULAR | Status: AC
  Filled 2020-10-06: qty 1

## 2020-10-06 MED ORDER — RABIES VACCINE, PCEC IM SUSR
1.0000 mL | Freq: Once | INTRAMUSCULAR | Status: AC
  Administered 2020-10-06: 1 mL via INTRAMUSCULAR

## 2020-10-06 NOTE — ED Notes (Addendum)
Eleven sutures were removed from patients upper lip and left buccal . Pt tolerated suture removal well.

## 2020-10-06 NOTE — ED Triage Notes (Signed)
Pt is present today for a suture removal and third vaccine.

## 2020-10-12 ENCOUNTER — Ambulatory Visit (HOSPITAL_COMMUNITY): Payer: Self-pay

## 2021-10-02 IMAGING — DX DG CHEST 1V PORT
1 series · 1 of 1 positions shown · non-contrast
Comparison: None.

CLINICAL DATA: Cough.

EXAM:
PORTABLE CHEST 1 VIEW

[chest ap]
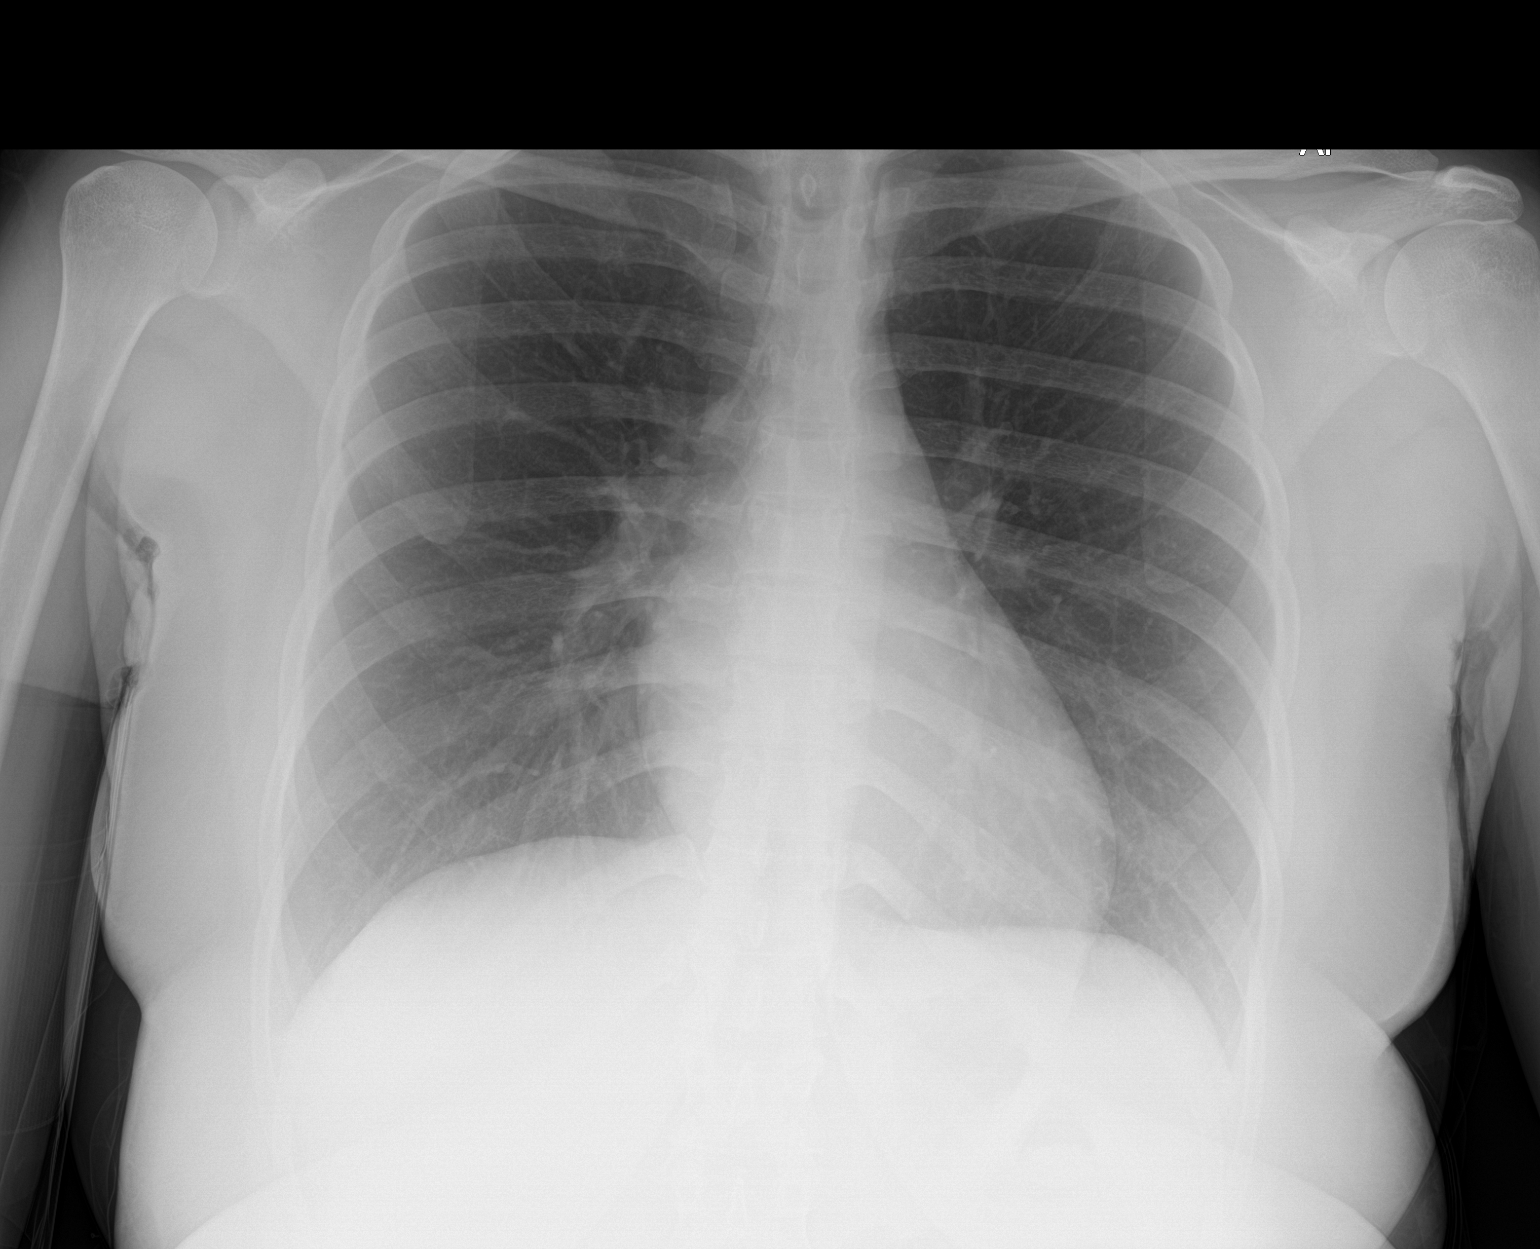

[1 of 1 positions shown; findings below may reference images not displayed]

FINDINGS: 4540 hours. The lungs are clear without focal pneumonia, edema,
pneumothorax or pleural effusion. The cardiopericardial silhouette
is within normal limits for size. The visualized bony structures of
the thorax show no acute abnormality.
IMPRESSION: No active disease.
